# Patient Record
Sex: Male | Born: 2000
Health system: Southern US, Community
[De-identification: ages and names within clinical notes are randomized; demographics above are authoritative.]

---
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
## 2020-01-22 DIAGNOSIS — Z20822 Contact with and (suspected) exposure to covid-19: Secondary | ICD-10-CM | POA: Diagnosis not present

## 2020-04-01 DIAGNOSIS — U071 COVID-19: Secondary | ICD-10-CM | POA: Diagnosis not present

## 2020-05-14 DIAGNOSIS — M546 Pain in thoracic spine: Secondary | ICD-10-CM | POA: Diagnosis not present

## 2020-05-14 DIAGNOSIS — M9902 Segmental and somatic dysfunction of thoracic region: Secondary | ICD-10-CM | POA: Diagnosis not present

## 2020-05-14 DIAGNOSIS — M9901 Segmental and somatic dysfunction of cervical region: Secondary | ICD-10-CM | POA: Diagnosis not present

## 2020-05-14 DIAGNOSIS — M531 Cervicobrachial syndrome: Secondary | ICD-10-CM | POA: Diagnosis not present

## 2020-05-19 DIAGNOSIS — M546 Pain in thoracic spine: Secondary | ICD-10-CM | POA: Diagnosis not present

## 2020-05-19 DIAGNOSIS — M531 Cervicobrachial syndrome: Secondary | ICD-10-CM | POA: Diagnosis not present

## 2020-05-19 DIAGNOSIS — M9901 Segmental and somatic dysfunction of cervical region: Secondary | ICD-10-CM | POA: Diagnosis not present

## 2020-05-19 DIAGNOSIS — M9902 Segmental and somatic dysfunction of thoracic region: Secondary | ICD-10-CM | POA: Diagnosis not present

## 2020-08-21 DIAGNOSIS — Z20828 Contact with and (suspected) exposure to other viral communicable diseases: Secondary | ICD-10-CM | POA: Diagnosis not present

## 2021-08-04 DIAGNOSIS — Z20828 Contact with and (suspected) exposure to other viral communicable diseases: Secondary | ICD-10-CM | POA: Diagnosis not present

## 2021-08-04 DIAGNOSIS — B349 Viral infection, unspecified: Secondary | ICD-10-CM | POA: Diagnosis not present

## 2021-08-04 DIAGNOSIS — R509 Fever, unspecified: Secondary | ICD-10-CM | POA: Diagnosis not present

## 2021-08-04 DIAGNOSIS — J0300 Acute streptococcal tonsillitis, unspecified: Secondary | ICD-10-CM | POA: Diagnosis not present
